# Patient Record
Sex: Female | Born: 1985 | Race: Black or African American | Hispanic: No | Marital: Single | Smoking: Current some day smoker
Health system: Southern US, Community
[De-identification: ages and names within clinical notes are randomized; demographics above are authoritative.]

## PROBLEM LIST (undated history)

## (undated) HISTORY — PX: WISDOM TOOTH EXTRACTION: SHX21

---

## 2008-02-07 ENCOUNTER — Emergency Department (HOSPITAL_COMMUNITY): Admission: EM | Admit: 2008-02-07 | Discharge: 2008-02-07 | Payer: Self-pay | Admitting: Emergency Medicine

## 2008-08-14 ENCOUNTER — Emergency Department (HOSPITAL_COMMUNITY): Admission: EM | Admit: 2008-08-14 | Discharge: 2008-08-14 | Payer: Self-pay | Admitting: Emergency Medicine

## 2011-05-08 ENCOUNTER — Emergency Department (HOSPITAL_COMMUNITY)
Admission: EM | Admit: 2011-05-08 | Discharge: 2011-05-09 | Discharge status: Against Medical Advice | Payer: Self-pay | Attending: Emergency Medicine | Admitting: Emergency Medicine

## 2011-05-08 DIAGNOSIS — Z0389 Encounter for observation for other suspected diseases and conditions ruled out: Secondary | ICD-10-CM | POA: Insufficient documentation

## 2011-05-08 NOTE — ED Notes (Signed)
Pt presents with onset of midsternal chest pain that began this morning. Pt reports dry cough x 2 days and shortness of breath x 2-3 days.  Pt reports getting over cold symptoms x 1 week.  +nasal congestion

## 2011-05-08 NOTE — ED Notes (Signed)
Not in waiting room x 2 

## 2012-07-21 ENCOUNTER — Emergency Department (HOSPITAL_COMMUNITY): Payer: No Typology Code available for payment source

## 2012-07-21 ENCOUNTER — Emergency Department (HOSPITAL_COMMUNITY)
Admission: EM | Admit: 2012-07-21 | Discharge: 2012-07-21 | Disposition: A | Discharge status: Home / Self Care | Payer: No Typology Code available for payment source | Attending: Emergency Medicine | Admitting: Emergency Medicine

## 2012-07-21 ENCOUNTER — Encounter (HOSPITAL_COMMUNITY): Payer: Self-pay

## 2012-07-21 DIAGNOSIS — S46909A Unspecified injury of unspecified muscle, fascia and tendon at shoulder and upper arm level, unspecified arm, initial encounter: Secondary | ICD-10-CM | POA: Insufficient documentation

## 2012-07-21 DIAGNOSIS — S6990XA Unspecified injury of unspecified wrist, hand and finger(s), initial encounter: Secondary | ICD-10-CM | POA: Insufficient documentation

## 2012-07-21 DIAGNOSIS — M25522 Pain in left elbow: Secondary | ICD-10-CM

## 2012-07-21 DIAGNOSIS — S199XXA Unspecified injury of neck, initial encounter: Secondary | ICD-10-CM | POA: Insufficient documentation

## 2012-07-21 DIAGNOSIS — Y92410 Unspecified street and highway as the place of occurrence of the external cause: Secondary | ICD-10-CM | POA: Insufficient documentation

## 2012-07-21 DIAGNOSIS — S59909A Unspecified injury of unspecified elbow, initial encounter: Secondary | ICD-10-CM | POA: Insufficient documentation

## 2012-07-21 DIAGNOSIS — M25511 Pain in right shoulder: Secondary | ICD-10-CM

## 2012-07-21 DIAGNOSIS — S4980XA Other specified injuries of shoulder and upper arm, unspecified arm, initial encounter: Secondary | ICD-10-CM | POA: Insufficient documentation

## 2012-07-21 DIAGNOSIS — F17200 Nicotine dependence, unspecified, uncomplicated: Secondary | ICD-10-CM | POA: Insufficient documentation

## 2012-07-21 DIAGNOSIS — Y9389 Activity, other specified: Secondary | ICD-10-CM | POA: Insufficient documentation

## 2012-07-21 DIAGNOSIS — J45909 Unspecified asthma, uncomplicated: Secondary | ICD-10-CM | POA: Insufficient documentation

## 2012-07-21 DIAGNOSIS — S0993XA Unspecified injury of face, initial encounter: Secondary | ICD-10-CM | POA: Insufficient documentation

## 2012-07-21 MED ORDER — NAPROXEN 500 MG PO TABS: 500.0000 mg | ORAL_TABLET | Freq: Two times a day (BID) | ORAL | Status: DC
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
## 2012-07-21 NOTE — ED Provider Notes (Signed)
Medical screening examination/treatment/procedure(s) were performed by non-physician practitioner and as supervising physician I was immediately available for consultation/collaboration.   Glynn Octave, MD 07/21/12 [phone]
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
## 2015-07-22 ENCOUNTER — Emergency Department (HOSPITAL_COMMUNITY): Payer: No Typology Code available for payment source

## 2015-07-22 ENCOUNTER — Encounter (HOSPITAL_COMMUNITY): Payer: Self-pay | Admitting: Emergency Medicine

## 2015-07-22 ENCOUNTER — Emergency Department (HOSPITAL_COMMUNITY)
Admission: EM | Admit: 2015-07-22 | Discharge: 2015-07-22 | Disposition: A | Discharge status: Home / Self Care | Payer: No Typology Code available for payment source | Attending: Emergency Medicine | Admitting: Emergency Medicine

## 2015-07-22 DIAGNOSIS — Y92410 Unspecified street and highway as the place of occurrence of the external cause: Secondary | ICD-10-CM | POA: Diagnosis not present

## 2015-07-22 DIAGNOSIS — J45909 Unspecified asthma, uncomplicated: Secondary | ICD-10-CM | POA: Diagnosis not present

## 2015-07-22 DIAGNOSIS — Y998 Other external cause status: Secondary | ICD-10-CM | POA: Diagnosis not present

## 2015-07-22 DIAGNOSIS — F17200 Nicotine dependence, unspecified, uncomplicated: Secondary | ICD-10-CM | POA: Diagnosis not present

## 2015-07-22 DIAGNOSIS — S46911A Strain of unspecified muscle, fascia and tendon at shoulder and upper arm level, right arm, initial encounter: Secondary | ICD-10-CM | POA: Diagnosis not present

## 2015-07-22 DIAGNOSIS — Y9389 Activity, other specified: Secondary | ICD-10-CM | POA: Insufficient documentation

## 2015-07-22 DIAGNOSIS — S20211A Contusion of right front wall of thorax, initial encounter: Secondary | ICD-10-CM | POA: Insufficient documentation

## 2015-07-22 DIAGNOSIS — S4991XA Unspecified injury of right shoulder and upper arm, initial encounter: Secondary | ICD-10-CM | POA: Diagnosis present

## 2015-07-22 MED ORDER — NAPROXEN 500 MG PO TABS: 500.0000 mg | ORAL_TABLET | Freq: Two times a day (BID) | ORAL | Status: AC

## 2015-07-22 MED ORDER — CYCLOBENZAPRINE HCL 10 MG PO TABS
10.0000 mg | ORAL_TABLET | Freq: Once | ORAL | Status: AC
Start: 2015-07-22 — End: 2015-07-22
  Administered 2015-07-22: 10 mg via ORAL
  Filled 2015-07-22: qty 1

## 2015-07-22 MED ORDER — CYCLOBENZAPRINE HCL 5 MG PO TABS
5.0000 mg | ORAL_TABLET | Freq: Three times a day (TID) | ORAL | Status: AC | PRN
Start: 2015-07-22 — End: ?

## 2015-07-22 MED ORDER — HYDROCODONE-ACETAMINOPHEN 5-325 MG PO TABS
1.0000 | ORAL_TABLET | Freq: Once | ORAL | Status: AC
  Administered 2015-07-22: 1 via ORAL
  Filled 2015-07-22: qty 1
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
## 2015-08-14 ENCOUNTER — Emergency Department (HOSPITAL_COMMUNITY): Payer: No Typology Code available for payment source

## 2015-08-14 ENCOUNTER — Emergency Department (HOSPITAL_COMMUNITY)
Admission: EM | Admit: 2015-08-14 | Discharge: 2015-08-14 | Disposition: A | Discharge status: Home / Self Care | Payer: No Typology Code available for payment source | Attending: Emergency Medicine | Admitting: Emergency Medicine

## 2015-08-14 ENCOUNTER — Encounter (HOSPITAL_COMMUNITY): Payer: Self-pay | Admitting: Emergency Medicine

## 2015-08-14 DIAGNOSIS — S301XXA Contusion of abdominal wall, initial encounter: Secondary | ICD-10-CM | POA: Insufficient documentation

## 2015-08-14 DIAGNOSIS — S40811A Abrasion of right upper arm, initial encounter: Secondary | ICD-10-CM | POA: Diagnosis not present

## 2015-08-14 DIAGNOSIS — J45909 Unspecified asthma, uncomplicated: Secondary | ICD-10-CM | POA: Insufficient documentation

## 2015-08-14 DIAGNOSIS — Y998 Other external cause status: Secondary | ICD-10-CM | POA: Diagnosis not present

## 2015-08-14 DIAGNOSIS — Z3202 Encounter for pregnancy test, result negative: Secondary | ICD-10-CM | POA: Insufficient documentation

## 2015-08-14 DIAGNOSIS — S3991XA Unspecified injury of abdomen, initial encounter: Secondary | ICD-10-CM | POA: Diagnosis present

## 2015-08-14 DIAGNOSIS — F17200 Nicotine dependence, unspecified, uncomplicated: Secondary | ICD-10-CM | POA: Diagnosis not present

## 2015-08-14 DIAGNOSIS — S3992XA Unspecified injury of lower back, initial encounter: Secondary | ICD-10-CM | POA: Insufficient documentation

## 2015-08-14 DIAGNOSIS — Y92410 Unspecified street and highway as the place of occurrence of the external cause: Secondary | ICD-10-CM | POA: Insufficient documentation

## 2015-08-14 DIAGNOSIS — Y9389 Activity, other specified: Secondary | ICD-10-CM | POA: Insufficient documentation

## 2015-08-14 DIAGNOSIS — Z791 Long term (current) use of non-steroidal anti-inflammatories (NSAID): Secondary | ICD-10-CM | POA: Diagnosis not present

## 2015-08-14 LAB — POC URINE PREG, ED: Preg Test, Ur: NEGATIVE

## 2015-08-14 MED ORDER — IBUPROFEN 800 MG PO TABS: 800.0000 mg | ORAL_TABLET | Freq: Three times a day (TID) | ORAL | Status: AC | PRN

## 2015-08-14 MED ORDER — IOPAMIDOL (ISOVUE-300) INJECTION 61%
INTRAVENOUS | Status: AC
  Administered 2015-08-14: 100 mL
  Filled 2015-08-14: qty 100

## 2015-08-14 MED ORDER — TRAMADOL HCL 50 MG PO TABS: 50.0000 mg | ORAL_TABLET | Freq: Four times a day (QID) | ORAL | Status: AC | PRN

## 2015-08-14 NOTE — ED Notes (Signed)
Patient able to ambulate independently  
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
## 2015-08-14 NOTE — ED Notes (Signed)
Patient transported to CT 
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
## 2015-08-14 NOTE — Discharge Instructions (Signed)
Return here as needed. Follow up with your doctor or an urgent care. Ice and heat to the area that is sore.
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
